# Patient Record
Sex: Male | Born: 1965 | Race: White | Hispanic: No | Marital: Married | Smoking: Never smoker
Health system: Southern US, Community
[De-identification: ages and names within clinical notes are randomized; demographics above are authoritative.]

---

## 2012-03-02 ENCOUNTER — Other Ambulatory Visit: Payer: Self-pay | Admitting: Orthopaedic Surgery

## 2012-03-02 NOTE — H&P (Signed)
  Patient Name: [name], [name]    Date of Birth: 12-06-1965    Shared ID #:  [id number] Date: 02/24/2012    AGE: 46 Years    Sex: M    Employer:     DOI:   History of present illness: A 46 year old male with no significant past medical history coming in with left arm pain.  Patient was lifting weights Friday night he was doing triceps extensions over his head and felt a pop with an audible sound and had significant decrease and strength immediately and had to drop the weight.  Patient states that he had swelling almost immediately but denies any numbness or.  The patient also denies any change in color or bruising.  Patient did go to the urgent care facility where he was told that he might have a tricep tear put in a splint and sent to followup with [place].  Patient states that the pain has been manageable with over-the-counter medications.  Patient states that he's had some more swelling recently.  Patient denies any numbness in the fingertips.  Patient is right-hand dominant.   PMHx: Medications: None.Allergies: None.  Hospitalizations: None.Surgeries: none  ROS: 14 point review of systems form filled out by the patient was reviewed and was negative as it relates to the history of present illness except for: [4]   FHx: Noncontributory  SocHx: Denies smoking and occasional alcohol use.  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  PE: Vitals review  Gen. no apparent distress male Respiratory: Patient able to speak in full sentences no accessory muscle use Abdominal exam: Nontender nondistended The patient's left upper extremity: Patient does have significant swelling of the left arm mostly just proximal to the olecranon and distally to the mid forearm.  Patient has no discoloration or bruising noted.  Patient does have good strength of the left arm board of 5 compared to his contralateral side of 5 out of 5.  Patient is neurovascularly intact distally. Skin no rash no erythema Mood and affect  normal.  Imaging/Tests: X-rays from January 5 were reviewed and shows potential avulsion with a tendinitis contracted from the olecranon.  Otherwise no gross abnormalities. Ultrasound done today showed that likely there is a tear in the triceps tendon at the insertion site hard to estimate percentage of tear.  Appears to be greater than 20% at least.  Significant swelling surrounding area as well.  No fractures noted.  Asses: Left triceps tear at the distal portion  Plan: At this point we do need further imaging an MRI has been ordered.  Patient has goal of going out-of-town on Saturday to the beach.  We will attempt to get this MRI early secondary to the urgency of potential intervention as well.  Patient is a very active man and would like this fixed if necessary.  We will not know until the MRI is done and we know how much of the tendon has been torn.  Patient will continue to wear a sling when out in public trying not to do any strengthening or weightlifting of the upper body until imaging is done. Followup with [place] after MRI to go over her options.  Dictated by Dr. Terrilee Files   Autoauthenticated, Dr. Jerl Santos

## 2012-03-03 ENCOUNTER — Encounter (HOSPITAL_COMMUNITY): Payer: Self-pay | Admitting: *Deleted

## 2012-03-03 ENCOUNTER — Ambulatory Visit (HOSPITAL_COMMUNITY): Payer: BC Managed Care – PPO | Admitting: Anesthesiology

## 2012-03-03 ENCOUNTER — Encounter (HOSPITAL_COMMUNITY)
Admission: RE | Disposition: A | Discharge status: Home / Self Care | Payer: Self-pay | Source: Ambulatory Visit | Attending: Orthopaedic Surgery

## 2012-03-03 ENCOUNTER — Encounter (HOSPITAL_COMMUNITY): Payer: Self-pay | Admitting: Anesthesiology

## 2012-03-03 ENCOUNTER — Ambulatory Visit (HOSPITAL_COMMUNITY)
Admission: RE | Admit: 2012-03-03 | Discharge: 2012-03-03 | Disposition: A | Discharge status: Home / Self Care | Payer: BC Managed Care – PPO | Source: Ambulatory Visit | Attending: Orthopaedic Surgery | Admitting: Orthopaedic Surgery

## 2012-03-03 DIAGNOSIS — Y9239 Other specified sports and athletic area as the place of occurrence of the external cause: Secondary | ICD-10-CM | POA: Insufficient documentation

## 2012-03-03 DIAGNOSIS — Y93B3 Activity, free weights: Secondary | ICD-10-CM | POA: Insufficient documentation

## 2012-03-03 DIAGNOSIS — T148XXA Other injury of unspecified body region, initial encounter: Secondary | ICD-10-CM

## 2012-03-03 DIAGNOSIS — S43499A Other sprain of unspecified shoulder joint, initial encounter: Secondary | ICD-10-CM | POA: Insufficient documentation

## 2012-03-03 DIAGNOSIS — X503XXA Overexertion from repetitive movements, initial encounter: Secondary | ICD-10-CM | POA: Insufficient documentation

## 2012-03-03 DIAGNOSIS — Y998 Other external cause status: Secondary | ICD-10-CM | POA: Insufficient documentation

## 2012-03-03 HISTORY — PX: BICEPS TENDON REPAIR: SHX566

## 2012-03-03 LAB — CBC
MCH: 33.1 pg (ref 26.0–34.0)
MCV: 89.9 fL (ref 78.0–100.0)
Platelets: 201 10*3/uL (ref 150–400)
RDW: 11.5 % (ref 11.5–15.5)
WBC: 5.2 10*3/uL (ref 4.0–10.5)

## 2012-03-03 LAB — SURGICAL PCR SCREEN: MRSA, PCR: NEGATIVE

## 2012-03-03 SURGERY — REPAIR, TENDON, BICEPS, PROXIMAL
Anesthesia: General | Site: Elbow | Laterality: Left | Wound class: Clean

## 2012-03-03 MED ORDER — PROPOFOL 10 MG/ML IV EMUL
INTRAVENOUS | Status: DC | PRN
  Administered 2012-03-03: 250 mg via INTRAVENOUS

## 2012-03-03 MED ORDER — HYDROMORPHONE HCL PF 1 MG/ML IJ SOLN
INTRAMUSCULAR | Status: AC
  Filled 2012-03-03: qty 1

## 2012-03-03 MED ORDER — MUPIROCIN 2 % EX OINT
TOPICAL_OINTMENT | Freq: Once | CUTANEOUS | Status: DC
  Filled 2012-03-03: qty 22

## 2012-03-03 MED ORDER — HYDROMORPHONE HCL PF 1 MG/ML IJ SOLN
0.2500 mg | INTRAMUSCULAR | Status: DC | PRN
  Administered 2012-03-03 (×4): 0.5 mg via INTRAVENOUS

## 2012-03-03 MED ORDER — PROMETHAZINE HCL 25 MG/ML IJ SOLN: 6.2500 mg | INTRAMUSCULAR | Status: DC | PRN

## 2012-03-03 MED ORDER — LACTATED RINGERS IV SOLN
INTRAVENOUS | Status: DC
  Administered 2012-03-03: 14:00:00 via INTRAVENOUS

## 2012-03-03 MED ORDER — ONDANSETRON HCL 4 MG/2ML IJ SOLN
INTRAMUSCULAR | Status: AC
  Administered 2012-03-03: 4 mg via INTRAVENOUS
  Filled 2012-03-03: qty 2

## 2012-03-03 MED ORDER — CEFAZOLIN SODIUM-DEXTROSE 2-3 GM-% IV SOLR
2.0000 g | INTRAVENOUS | Status: AC
  Administered 2012-03-03: 2 g via INTRAVENOUS

## 2012-03-03 MED ORDER — LIDOCAINE HCL (CARDIAC) 20 MG/ML IV SOLN
INTRAVENOUS | Status: DC | PRN
  Administered 2012-03-03: 40 mg via INTRAVENOUS

## 2012-03-03 MED ORDER — OXYCODONE-ACETAMINOPHEN 5-325 MG PO TABS
1.0000 | ORAL_TABLET | Freq: Once | ORAL | Status: AC
  Administered 2012-03-03: 2 via ORAL

## 2012-03-03 MED ORDER — 0.9 % SODIUM CHLORIDE (POUR BTL) OPTIME
TOPICAL | Status: DC | PRN
  Administered 2012-03-03: 1000 mL

## 2012-03-03 MED ORDER — OXYCODONE-ACETAMINOPHEN 5-325 MG PO TABS: 1.0000 | ORAL_TABLET | ORAL | Status: AC | PRN

## 2012-03-03 MED ORDER — NEOSTIGMINE METHYLSULFATE 1 MG/ML IJ SOLN
INTRAMUSCULAR | Status: DC | PRN
  Administered 2012-03-03: 3 mg via INTRAVENOUS

## 2012-03-03 MED ORDER — OXYCODONE-ACETAMINOPHEN 5-325 MG PO TABS
ORAL_TABLET | ORAL | Status: AC
  Filled 2012-03-03: qty 2

## 2012-03-03 MED ORDER — MUPIROCIN 2 % EX OINT
TOPICAL_OINTMENT | CUTANEOUS | Status: AC
  Administered 2012-03-03: 1 via NASAL
  Filled 2012-03-03: qty 22

## 2012-03-03 MED ORDER — CHLORHEXIDINE GLUCONATE 4 % EX LIQD: 60.0000 mL | Freq: Once | CUTANEOUS | Status: DC

## 2012-03-03 MED ORDER — MEPERIDINE HCL 25 MG/ML IJ SOLN: 6.2500 mg | INTRAMUSCULAR | Status: DC | PRN

## 2012-03-03 MED ORDER — BUPIVACAINE-EPINEPHRINE PF 0.5-1:200000 % IJ SOLN
INTRAMUSCULAR | Status: DC | PRN
  Administered 2012-03-03: 30 mL

## 2012-03-03 MED ORDER — MIDAZOLAM HCL 2 MG/2ML IJ SOLN
1.0000 mg | INTRAMUSCULAR | Status: DC | PRN
  Administered 2012-03-03 (×2): 1 mg via INTRAVENOUS

## 2012-03-03 MED ORDER — FENTANYL CITRATE 0.05 MG/ML IJ SOLN
50.0000 ug | INTRAMUSCULAR | Status: DC | PRN
  Administered 2012-03-03 (×2): 50 ug via INTRAVENOUS

## 2012-03-03 MED ORDER — ONDANSETRON HCL 4 MG/2ML IJ SOLN
4.0000 mg | Freq: Once | INTRAMUSCULAR | Status: AC
  Administered 2012-03-03: 4 mg via INTRAVENOUS

## 2012-03-03 MED ORDER — CEFAZOLIN SODIUM-DEXTROSE 2-3 GM-% IV SOLR
INTRAVENOUS | Status: AC
  Filled 2012-03-03: qty 50

## 2012-03-03 MED ORDER — ROCURONIUM BROMIDE 100 MG/10ML IV SOLN
INTRAVENOUS | Status: DC | PRN
  Administered 2012-03-03: 50 mg via INTRAVENOUS

## 2012-03-03 MED ORDER — LACTATED RINGERS IV SOLN
INTRAVENOUS | Status: DC | PRN
  Administered 2012-03-03 (×2): via INTRAVENOUS

## 2012-03-03 MED ORDER — ONDANSETRON HCL 4 MG/2ML IJ SOLN
INTRAMUSCULAR | Status: DC | PRN
  Administered 2012-03-03: 4 mg via INTRAVENOUS

## 2012-03-03 MED ORDER — GLYCOPYRROLATE 0.2 MG/ML IJ SOLN
INTRAMUSCULAR | Status: DC | PRN
  Administered 2012-03-03: .4 mg via INTRAVENOUS

## 2012-03-03 MED ORDER — FENTANYL CITRATE 0.05 MG/ML IJ SOLN
INTRAMUSCULAR | Status: AC
  Filled 2012-03-03: qty 2

## 2012-03-03 MED ORDER — MIDAZOLAM HCL 2 MG/2ML IJ SOLN
INTRAMUSCULAR | Status: AC
  Filled 2012-03-03: qty 2

## 2012-03-03 MED ORDER — FENTANYL CITRATE 0.05 MG/ML IJ SOLN
INTRAMUSCULAR | Status: DC | PRN
  Administered 2012-03-03 (×2): 50 ug via INTRAVENOUS
  Administered 2012-03-03: 100 ug via INTRAVENOUS

## 2012-03-03 MED ORDER — MIDAZOLAM HCL 2 MG/2ML IJ SOLN: 0.5000 mg | Freq: Once | INTRAMUSCULAR | Status: DC | PRN

## 2012-03-03 SURGICAL SUPPLY — 65 items
BANDAGE ELASTIC 3 VELCRO ST LF (GAUZE/BANDAGES/DRESSINGS) IMPLANT
BANDAGE ELASTIC 4 VELCRO ST LF (GAUZE/BANDAGES/DRESSINGS) ×2 IMPLANT
BANDAGE GAUZE ELAST BULKY 4 IN (GAUZE/BANDAGES/DRESSINGS) ×2 IMPLANT
BENZOIN TINCTURE PRP APPL 2/3 (GAUZE/BANDAGES/DRESSINGS) IMPLANT
BNDG ESMARK 4X9 LF (GAUZE/BANDAGES/DRESSINGS) ×2 IMPLANT
CLOTH BEACON ORANGE TIMEOUT ST (SAFETY) ×2 IMPLANT
CUFF TOURNIQUET SINGLE 18IN (TOURNIQUET CUFF) ×2 IMPLANT
CUFF TOURNIQUET SINGLE 24IN (TOURNIQUET CUFF) IMPLANT
DECANTER SPIKE VIAL GLASS SM (MISCELLANEOUS) IMPLANT
DRAIN PENROSE 1/4X12 LTX STRL (WOUND CARE) IMPLANT
DRAPE OEC MINIVIEW 54X84 (DRAPES) ×2 IMPLANT
DRAPE U-SHAPE 47X51 STRL (DRAPES) ×2 IMPLANT
DRSG EMULSION OIL 3X3 NADH (GAUZE/BANDAGES/DRESSINGS) ×2 IMPLANT
DRSG PAD ABDOMINAL 8X10 ST (GAUZE/BANDAGES/DRESSINGS) ×2 IMPLANT
ELECT REM PT RETURN 9FT ADLT (ELECTROSURGICAL) ×2
ELECTRODE REM PT RTRN 9FT ADLT (ELECTROSURGICAL) ×1 IMPLANT
GLOVE BIO SURGEON STRL SZ8.5 (GLOVE) ×2 IMPLANT
GLOVE BIOGEL PI IND STRL 8 (GLOVE) ×1 IMPLANT
GLOVE BIOGEL PI IND STRL 8.5 (GLOVE) ×1 IMPLANT
GLOVE BIOGEL PI INDICATOR 8 (GLOVE) ×1
GLOVE BIOGEL PI INDICATOR 8.5 (GLOVE) ×1
GLOVE SS BIOGEL STRL SZ 8 (GLOVE) ×1 IMPLANT
GLOVE SUPERSENSE BIOGEL SZ 8 (GLOVE) ×1
GOWN PREVENTION PLUS XLARGE (GOWN DISPOSABLE) ×4 IMPLANT
GOWN PREVENTION PLUS XXLARGE (GOWN DISPOSABLE) ×2 IMPLANT
GOWN STRL NON-REIN LRG LVL3 (GOWN DISPOSABLE) ×4 IMPLANT
KIT BASIN OR (CUSTOM PROCEDURE TRAY) ×2 IMPLANT
KIT ROOM TURNOVER OR (KITS) ×2 IMPLANT
LOOP VESSEL MAXI BLUE (MISCELLANEOUS) IMPLANT
NDL SUT 6 .5 CRC .975X.05 MAYO (NEEDLE) IMPLANT
NEEDLE HYPO 25X1 1.5 SAFETY (NEEDLE) IMPLANT
NEEDLE MAYO TAPER (NEEDLE)
NS IRRIG 1000ML POUR BTL (IV SOLUTION) ×2 IMPLANT
PACK ORTHO EXTREMITY (CUSTOM PROCEDURE TRAY) ×2 IMPLANT
PAD ARMBOARD 7.5X6 YLW CONV (MISCELLANEOUS) ×4 IMPLANT
PAD CAST 4YDX4 CTTN HI CHSV (CAST SUPPLIES) ×1 IMPLANT
PADDING CAST ABS 4INX4YD NS (CAST SUPPLIES) ×1
PADDING CAST ABS COTTON 4X4 ST (CAST SUPPLIES) ×1 IMPLANT
PADDING CAST COTTON 4X4 STRL (CAST SUPPLIES) ×1
SLING ARM FOAM STRAP LRG (SOFTGOODS) ×2 IMPLANT
SLING ARM FOAM STRAP MED (SOFTGOODS) IMPLANT
SPONGE GAUZE 4X4 12PLY (GAUZE/BANDAGES/DRESSINGS) ×2 IMPLANT
SPONGE LAP 4X18 X RAY DECT (DISPOSABLE) IMPLANT
STRIP CLOSURE SKIN 1/2X4 (GAUZE/BANDAGES/DRESSINGS) IMPLANT
SUCTION FRAZIER TIP 10 FR DISP (SUCTIONS) ×2 IMPLANT
SUT 2 FIBERLOOP 20 STRT BLUE (SUTURE)
SUT BONE WAX W31G (SUTURE) IMPLANT
SUT ETHILON 3 0 PS 1 (SUTURE) ×4 IMPLANT
SUT ETHILON 4 0 PS 2 18 (SUTURE) ×2 IMPLANT
SUT FIBERWIRE #2 38 T-5 BLUE (SUTURE) ×4
SUT PROLENE 4 0 PS 2 18 (SUTURE) IMPLANT
SUT VIC AB 0 CT1 27 (SUTURE) ×1
SUT VIC AB 0 CT1 27XBRD ANBCTR (SUTURE) ×1 IMPLANT
SUT VIC AB 1 CT1 27 (SUTURE)
SUT VIC AB 1 CT1 27XBRD ANBCTR (SUTURE) IMPLANT
SUT VIC AB 2-0 SH 27 (SUTURE) ×1
SUT VIC AB 2-0 SH 27XBRD (SUTURE) ×1 IMPLANT
SUTURE 2 FIBERLOOP 20 STRT BLU (SUTURE) IMPLANT
SUTURE FIBERWR #2 38 T-5 BLUE (SUTURE) ×2 IMPLANT
SYR CONTROL 10ML LL (SYRINGE) IMPLANT
TOWEL OR 17X24 6PK STRL BLUE (TOWEL DISPOSABLE) ×2 IMPLANT
TOWEL OR 17X26 10 PK STRL BLUE (TOWEL DISPOSABLE) ×2 IMPLANT
TUBE CONNECTING 12X1/4 (SUCTIONS) ×2 IMPLANT
UNDERPAD 30X30 INCONTINENT (UNDERPADS AND DIAPERS) ×2 IMPLANT
WATER STERILE IRR 1000ML POUR (IV SOLUTION) ×2 IMPLANT

## 2012-03-03 NOTE — Transfer of Care (Signed)
Immediate Anesthesia Transfer of Care Note  Patient: [name]  Procedure(s) Performed: Procedure(s) (LRB): PROXIMAL BICEPS TENDON REPAIR (Left)  Patient Location: PACU  Anesthesia Type: General and Regional  Level of Consciousness: awake, alert  and oriented  Airway & Oxygen Therapy: Patient Spontanous Breathing and Patient connected to nasal cannula oxygen  Post-op Assessment: Report given to PACU RN  Post vital signs: Reviewed and stable  Complications: No apparent anesthesia complications

## 2012-03-03 NOTE — Anesthesia Procedure Notes (Signed)
Anesthesia Regional Block:  Interscalene brachial plexus block  Pre-Anesthetic Checklist: ,, timeout performed, Correct Patient, Correct Site, Correct Laterality, Correct Procedure, Correct Position, site marked, Risks and benefits discussed,  Surgical consent,  Pre-op evaluation,  At surgeon's request and post-op pain management  Laterality: Left  Prep: chloraprep       Needles:  Injection technique: Single-shot  Needle Type: Echogenic Stimulator Needle     Needle Length: 5cm 5 cm Needle Gauge: 22 and 22 G    Additional Needles:  Procedures: ultrasound guided and nerve stimulator Interscalene brachial plexus block  Nerve Stimulator or Paresthesia:  Response: biceps flexion, 0.45 mA,   Additional Responses:   Narrative:  Start time: 03/03/2012 2:22 PM End time: 03/03/2012 2:36 PM Injection made incrementally with aspirations every 5 mL.  Performed by: Personally  Anesthesiologist: Dr Chaney Malling  Additional Notes: Functioning IV was confirmed and monitors were applied.  A 50mm 22ga Arrow echogenic stimulator needle was used. Sterile prep and drape,hand hygiene and sterile gloves were used.  Negative aspiration and negative test dose prior to incremental administration of local anesthetic. The patient tolerated the procedure well.  Ultrasound guidance: relevent anatomy identified, needle position confirmed, local anesthetic spread visualized around nerve(s), vascular puncture avoided.  Image printed for medical record.   Interscalene brachial plexus block

## 2012-03-03 NOTE — Op Note (Signed)
#  [id number] 

## 2012-03-03 NOTE — Progress Notes (Signed)
CLIENT DRANK COKE AND THEN VOMITED ABOUT 200CC YELLOW LIQUID AND THEN STATES FEELS SOME BETTER AND STATES DOES WANT TO GO HOME

## 2012-03-03 NOTE — Anesthesia Postprocedure Evaluation (Signed)
  Anesthesia Post-op Note  Patient: [name]  Procedure(s) Performed: Procedure(s) (LRB): PROXIMAL BICEPS TENDON REPAIR (Left)  Patient Location: PACU  Anesthesia Type: GA combined with regional for post-op pain  Level of Consciousness: sedated  Airway and Oxygen Therapy: Patient Spontanous Breathing and Patient connected to nasal cannula oxygen  Post-op Pain: mild  Post-op Assessment: Post-op Vital signs reviewed, Patient's Cardiovascular Status Stable, Respiratory Function Stable, Patent Airway, No signs of Nausea or vomiting and Pain level controlled  Post-op Vital Signs: Reviewed and stable  Complications: No apparent anesthesia complications

## 2012-03-03 NOTE — Progress Notes (Signed)
DR OSSEY NOTIFIED OF C/O NAUSEA AND ORDER NOTED AND MED GIVEN AND THEN CLIENT C/O 6/10 LEFT ELBOW PAIN AND DR OSSEY ADVISED CLIENT CAN BE DISCHARGED WITH PAIN OR CAN RETURN TO PACU FOR IV PAIN MED AND CLIENT STATES WANTS TO GO HOME

## 2012-03-03 NOTE — Preoperative (Signed)
Beta Blockers   Reason not to administer Beta Blockers:Not Applicable 

## 2012-03-03 NOTE — Interval H&P Note (Signed)
History and Physical Interval Note:  03/03/2012 1:02 PM  [name]  has presented today for surgery, with the diagnosis of LEFT TRICEPS TEAR  The various methods of treatment have been discussed with the patient and family. After consideration of risks, benefits and other options for treatment, the patient has consented to  Procedure(s) (LRB): PROXIMAL BICEPS TENDON REPAIR (Left) as a surgical intervention .  The patient's history has been reviewed, patient examined, no change in status, stable for surgery.  I have reviewed the patients' chart and labs.  Questions were answered to the patient's satisfaction.     Mikhayla Phillis G

## 2012-03-03 NOTE — OR Nursing (Signed)
Feels lightheaded upon rising/ sat on SOb foro @ and has resided slowly, continues to ask to go, states he is " feeling better"

## 2012-03-03 NOTE — Anesthesia Preprocedure Evaluation (Signed)
Anesthesia Evaluation  Patient identified by MRN, date of birth, ID band Patient awake    Reviewed: Allergy & Precautions, H&P , NPO status , Patient's Chart, lab work & pertinent test results  History of Anesthesia Complications Negative for: history of anesthetic complications  Airway Mallampati: I TM Distance: >3 FB Neck ROM: Full    Dental No notable dental hx. (+) Teeth Intact and Dental Advisory Given   Pulmonary neg pulmonary ROS,  breath sounds clear to auscultation  Pulmonary exam normal       Cardiovascular negative cardio ROS  Rhythm:Regular Rate:Normal     Neuro/Psych negative neurological ROS  negative psych ROS   GI/Hepatic negative GI ROS, Neg liver ROS,   Endo/Other  negative endocrine ROS  Renal/GU negative Renal ROS     Musculoskeletal   Abdominal   Peds  Hematology   Anesthesia Other Findings   Reproductive/Obstetrics                           Anesthesia Physical Anesthesia Plan  ASA: I  Anesthesia Plan: General   Post-op Pain Management:    Induction: Intravenous  Airway Management Planned: Oral ETT  Additional Equipment:   Intra-op Plan:   Post-operative Plan: Extubation in OR  Informed Consent: I have reviewed the patients History and Physical, chart, labs and discussed the procedure including the risks, benefits and alternatives for the proposed anesthesia with the patient or authorized representative who has indicated his/her understanding and acceptance.   Dental advisory given  Plan Discussed with: CRNA and Surgeon  Anesthesia Plan Comments: (Plan routine monitors, GETA with interscalene block for post op analgesia)        Anesthesia Quick Evaluation

## 2012-03-04 ENCOUNTER — Encounter (HOSPITAL_COMMUNITY): Payer: Self-pay | Admitting: Orthopaedic Surgery

## 2012-03-04 NOTE — Op Note (Signed)
[name], [name] NO.:  [id number]  MEDICAL RECORD NO.:  [id number]  LOCATION:  [facility]                         FACILITY:  [facility]  PHYSICIAN:  Lubertha Basque. Ann-Marie Kluge, M.D.DATE OF BIRTH:  1966-05-24  DATE OF PROCEDURE:  03/03/2012 DATE OF DISCHARGE:  03/03/2012                              OPERATIVE REPORT   PREOPERATIVE DIAGNOSIS:  Left triceps rupture.  POSTOPERATIVE DIAGNOSIS:  Left triceps rupture.  PROCEDURE:  Left triceps repair.  ANESTHESIA:  General and block.  ATTENDING SURGEON:  Lubertha Basque. Jerl Santos, MD  ASSISTANT:  Lindwood Qua, PA  INDICATION FOR PROCEDURE:  The patient is a 46 year old man who ruptured his left triceps about a week ago while weightlifting.  By MRI scan, this involves most of the triceps and is pulled off by 1 cm or 2.  He is offered operative repair.  Informed operative consent was obtained after discussion of possible complications including reaction to anesthesia, infection, neurovascular injury, and repeat rupture.  SUMMARY OF FINDINGS AND PROCEDURE:  Under general anesthesia and a block through a straight posterior incision, a left triceps rupture was exposed and repaired using 4 limbs of #2 FiberWire secured to the olecranon through 3 bony tunnels.  Bryna Colander assisted throughout and was invaluable to the completion of the case in that he helped to maintain exposure while I placed suture material.  He also closed simultaneously to help minimize OR time.  DESCRIPTION OF PROCEDURE:  The patient was taken to the operating suite where general anesthetic was applied without difficulty.  He was also given a block in the pre-anesthesia area.  He was positioned in the lateral decubitus position with a beanbag and use of an axillary roll. He was then prepped and draped in normal sterile fashion.  After the administration of IV Kefzol and an appropriate time out, the left arm was elevated, exsanguinated, and tourniquet inflated  about the upper arm.  A posterior incision was made just slightly lateral to midline with dissection down to the extensor mechanism.  He had a great deal of bursal fluid and some of the bursal sac was excised.  The triceps was completely detached and shortened by 1 cm or 2.  This was thoroughly irrigated.  I freshened the attachment site of the olecranon with knife and rongeur.  I then made 3 longitudinal drill holes from the attachment site on the olecranon out the dorsal aspect of the ulna.  I placed 2 FiberWires through the tunnels with 2 limbs through the middle tunnel and 1 each through the medial and lateral tunnels.  We then passed a banal fashion through the triceps tendon.  I kept the knots on the triceps size that would not be prominent at the olecranon.  Once both of these #2 FiberWire sutures were oversewn down, this seemed to give [place] a nice tight repair.  I could easily flex in 90 degrees and the repair was stable.  I over sewed just some tissues with 0-Vicryl.  The tourniquet was deflated and a small amount of bleeding was easily controlled with Bovie cautery and some pressure.  I then reapproximated subcutaneous tissues with 0 and 2-0 undyed  Vicryl followed by skin closure with nylon.  Adaptic was applied followed by dry gauze and a posterior splint of plaster with the elbow in about 60 degrees of flexion.  Estimated blood loss and intraoperative fluids can be obtained from anesthesia records as can accurate tourniquet time.  DISPOSITION:  The patient was extubated in the operating room and taken to the recovery room in stable condition.  He was to go home same-day and follow up in the office in less than a week.  I will contact him by phone tonight.     Lubertha Basque Jerl Santos, M.D.     PGD/MEDQ  D:  03/03/2012  T:  03/04/2012  Job:  [id number]

## 2017-12-31 ENCOUNTER — Emergency Department (HOSPITAL_COMMUNITY)
Admission: EM | Admit: 2017-12-31 | Discharge: 2017-12-31 | Disposition: A | Discharge status: Home / Self Care | Payer: BLUE CROSS/BLUE SHIELD | Attending: Emergency Medicine | Admitting: Emergency Medicine

## 2017-12-31 ENCOUNTER — Emergency Department (HOSPITAL_COMMUNITY): Payer: BLUE CROSS/BLUE SHIELD

## 2017-12-31 ENCOUNTER — Encounter (HOSPITAL_COMMUNITY): Payer: Self-pay | Admitting: Emergency Medicine

## 2017-12-31 DIAGNOSIS — F10239 Alcohol dependence with withdrawal, unspecified: Secondary | ICD-10-CM | POA: Diagnosis not present

## 2017-12-31 DIAGNOSIS — F10230 Alcohol dependence with withdrawal, uncomplicated: Secondary | ICD-10-CM

## 2017-12-31 DIAGNOSIS — R42 Dizziness and giddiness: Secondary | ICD-10-CM | POA: Diagnosis not present

## 2017-12-31 DIAGNOSIS — Z79899 Other long term (current) drug therapy: Secondary | ICD-10-CM | POA: Diagnosis not present

## 2017-12-31 LAB — URINALYSIS, ROUTINE W REFLEX MICROSCOPIC
BILIRUBIN URINE: NEGATIVE
Glucose, UA: NEGATIVE mg/dL
HGB URINE DIPSTICK: NEGATIVE
KETONES UR: NEGATIVE mg/dL
Leukocytes, UA: NEGATIVE
NITRITE: NEGATIVE
PROTEIN: NEGATIVE mg/dL
SPECIFIC GRAVITY, URINE: 1.003 — AB (ref 1.005–1.030)
pH: 6 (ref 5.0–8.0)

## 2017-12-31 LAB — CBC
HCT: 45.9 % (ref 39.0–52.0)
HEMOGLOBIN: 16.8 g/dL (ref 13.0–17.0)
MCH: 33.3 pg (ref 26.0–34.0)
MCHC: 36.6 g/dL — AB (ref 30.0–36.0)
MCV: 91.1 fL (ref 78.0–100.0)
PLATELETS: 219 10*3/uL (ref 150–400)
RBC: 5.04 MIL/uL (ref 4.22–5.81)
RDW: 11.7 % (ref 11.5–15.5)
WBC: 8.3 10*3/uL (ref 4.0–10.5)

## 2017-12-31 LAB — HEPATIC FUNCTION PANEL
ALT: 41 U/L (ref 17–63)
AST: 39 U/L (ref 15–41)
Albumin: 4.6 g/dL (ref 3.5–5.0)
Alkaline Phosphatase: 68 U/L (ref 38–126)
Bilirubin, Direct: 0.1 mg/dL (ref 0.1–0.5)
Indirect Bilirubin: 0.5 mg/dL (ref 0.3–0.9)
Total Bilirubin: 0.6 mg/dL (ref 0.3–1.2)
Total Protein: 7.9 g/dL (ref 6.5–8.1)

## 2017-12-31 LAB — BASIC METABOLIC PANEL
ANION GAP: 12 (ref 5–15)
BUN: 21 mg/dL — ABNORMAL HIGH (ref 6–20)
CALCIUM: 9.2 mg/dL (ref 8.9–10.3)
CHLORIDE: 101 mmol/L (ref 101–111)
CO2: 23 mmol/L (ref 22–32)
CREATININE: 0.96 mg/dL (ref 0.61–1.24)
GFR calc Af Amer: >60 mL/min (ref >60)
Glucose, Bld: 156 mg/dL — ABNORMAL HIGH (ref 65–99)
Potassium: 3.7 mmol/L (ref 3.5–5.1)
SODIUM: 136 mmol/L (ref 135–145)

## 2017-12-31 MED ORDER — CHLORDIAZEPOXIDE HCL 25 MG PO CAPS: ORAL_CAPSULE | ORAL | 0 refills | Status: AC

## 2017-12-31 MED ORDER — VITAMIN B-1 100 MG PO TABS: 100.0000 mg | ORAL_TABLET | Freq: Every day | ORAL | Status: DC

## 2017-12-31 MED ORDER — LORAZEPAM 2 MG/ML IJ SOLN: 0.0000 mg | Freq: Four times a day (QID) | INTRAMUSCULAR | Status: DC

## 2017-12-31 MED ORDER — LORAZEPAM 2 MG/ML IJ SOLN
1.0000 mg | Freq: Once | INTRAMUSCULAR | Status: AC
  Administered 2017-12-31: 1 mg via INTRAVENOUS
  Filled 2017-12-31: qty 1

## 2017-12-31 MED ORDER — THIAMINE HCL 100 MG/ML IJ SOLN
100.0000 mg | Freq: Every day | INTRAMUSCULAR | Status: DC
  Administered 2017-12-31: 100 mg via INTRAVENOUS
  Filled 2017-12-31: qty 2

## 2017-12-31 MED ORDER — LORAZEPAM 1 MG PO TABS: 0.0000 mg | ORAL_TABLET | Freq: Four times a day (QID) | ORAL | Status: DC

## 2017-12-31 MED ORDER — LORAZEPAM 2 MG/ML IJ SOLN: 0.0000 mg | Freq: Two times a day (BID) | INTRAMUSCULAR | Status: DC

## 2017-12-31 MED ORDER — LORAZEPAM 1 MG PO TABS: 0.0000 mg | ORAL_TABLET | Freq: Two times a day (BID) | ORAL | Status: DC

## 2017-12-31 MED ORDER — DIAZEPAM 5 MG PO TABS
10.0000 mg | ORAL_TABLET | Freq: Once | ORAL | Status: AC
  Administered 2017-12-31: 10 mg via ORAL
  Filled 2017-12-31: qty 2

## 2017-12-31 MED ORDER — SODIUM CHLORIDE 0.9 % IV BOLUS
1000.0000 mL | Freq: Once | INTRAVENOUS | Status: AC
  Administered 2017-12-31: 1000 mL via INTRAVENOUS

## 2017-12-31 NOTE — Discharge Instructions (Signed)
You may take the clonidine taper as prescribed but do not drink any alcohol if you plan on doing this.  Alternatively, you may slowly wean yourself down on your usual alcohol intake.  Drink plenty of water and get plenty of rest.  Follow-up with a primary care physician for reevaluation.  Recheck your blood pressure occasionally.  If it is persistently elevated, you may need to follow-up with your primary care physician for medication management.  You may also follow-up with numerous outpatient resources that I have attached to this paperwork.  Return to the emergency department immediately for any concerning signs or symptoms develop such as hallucinations, seizures, high fevers, vision changes, or passing out.

## 2017-12-31 NOTE — ED Triage Notes (Addendum)
Pt reports while today at home working on computer felt dizzy and blurred vision when checking an email from being outside for 15 mintues. Repots had breakfast and lunch and protein shakes today. Reports drank two bottles of water and called wife. Reports laid down then went [facility] where feeling better but was told to go to ED. Repots lasted about 2 hours and feels fine. Only complaint at this time is dry mouth.

## 2017-12-31 NOTE — ED Provider Notes (Signed)
[facility]-EMERGENCY DEPT Provider Note   CSN: [id number] Arrival date & time: 12/31/17  1714     History   Chief Complaint Chief Complaint  Patient presents with  . Dizziness  . dry mouth    HPI [name] is a 52 y.o. male presents for evaluation of acute onset, resolved episode of dizziness and blurred vision.  Patient states that he typically works from home and spent around 15 minutes outside resting when he went back inside at around 2 PM working on his computer.  He states that he developed sudden onset of blurred vision and feeling unsteady and "wobbly ".  He states that he had one episode which lasted less than a minute and resolved but a few minutes later developed another episode with similar symptoms lasting over an hour.  He states that he felt unsteady while ambulating but denies any falls or trauma.  He denies syncope.  No aggravating or alleviating factors noted.  His symptoms did not worsen positionally but he did note that he felt "okay "with his eyes closed.  He went to an urgent care who recommended presentation to the ED.  At around 4:30 PM his symptoms spontaneously resolved.  He states his only residual symptom is he feels very thirsty and has a dry mouth.  He does not think that he has been dehydrated as he typically drinks 2-4 bottles full of water daily.  He also drinks at least 6 light beers daily after work, more on the weekends. He generally has difficulty quantifying how much alcohol he drinks daily. He does state that with the improving weather, he has been drinking more alcohol daily. Last alcoholic beverage was last night, almost 24 hours ago. He is a non-smoker, denies any recreational drug use.  He denies any associated shortness of breath, chest pain, headaches, diaphoresis, nausea, vomiting, or lightheadedness.  He did not try anything for his symptoms prior to arrival. He has not been evaluated by a primary care physician in 7 years and is  unsure if he has any chronic medical conditions.   The history is provided by the patient.    History reviewed. No pertinent past medical history.  There are no active problems to display for this patient.   Past Surgical History:  Procedure Laterality Date  . BICEPS TENDON REPAIR  03/03/2012   Procedure: PROXIMAL BICEPS TENDON REPAIR;  Surgeon: Velna Ochs, MD;  Location: [facility] OR;  Service: Orthopedics;  Laterality: Left;  Left Tricep Tendon Repair        Home Medications    Prior to Admission medications   Medication Sig Start Date End Date Taking? Authorizing Provider  Multiple Vitamin (MULTIVITAMIN WITH MINERALS) TABS Take 1 tablet by mouth daily.   Yes [provider]  chlordiazePOXIDE (LIBRIUM) 25 MG capsule  PO TID x 1D, then 25-50mg  PO BID X 1D, then 25-50mg  PO QD X 1D 12/31/17   Valeska Haislip A, PA-C    Family History No family history on file.  Social History Social History   Tobacco Use  . Smoking status: Never Smoker  . Smokeless tobacco: Never Used  Substance Use Topics  . Alcohol use: Yes  . Drug use: No     Allergies   Patient has no known allergies.   Review of Systems Review of Systems  Constitutional: Negative for diaphoresis and fatigue.  Eyes: Positive for visual disturbance.  Respiratory: Negative for shortness of breath.   Cardiovascular: Negative for chest pain.  Gastrointestinal: Negative for abdominal pain, nausea and vomiting.  Neurological: Positive for dizziness. Negative for syncope, speech difficulty, weakness, light-headedness, numbness and headaches.  All other systems reviewed and are negative.    Physical Exam Updated Vital Signs BP 127/78   Pulse 95   Temp 98.1 F (36.7 C) (Oral)   Resp 18   SpO2 96%   Physical Exam  Constitutional: He is oriented to person, place, and time. He appears well-developed and well-nourished. No distress.  HENT:  Head: Normocephalic and atraumatic.  Eyes: Pupils are equal,  round, and reactive to light. Conjunctivae and EOM are normal. Right eye exhibits no discharge. Left eye exhibits no discharge.  Neck: Normal range of motion. Neck supple. No JVD present. No tracheal deviation present.  Cardiovascular: Regular rhythm.  Tachycardic, 2+ radial and DP/PT pulses bl, negative Homan's bl, no LE edema   Pulmonary/Chest: Effort normal and breath sounds normal.  Abdominal: Soft. Bowel sounds are normal. He exhibits no distension. There is hepatomegaly. There is no tenderness. There is no guarding.  Musculoskeletal: Normal range of motion. He exhibits no edema or tenderness.  Neurological: He is alert and oriented to person, place, and time. No cranial nerve deficit or sensory deficit. He exhibits normal muscle tone.  Mental Status:  Alert, thought content appropriate, able to give a coherent history. Speech fluent without evidence of aphasia. Able to follow 2 step commands without difficulty.  Cranial Nerves:  II:  Peripheral visual fields grossly normal, pupils equal, round, reactive to light III,IV, VI: ptosis not present, extra-ocular motions intact bilaterally  V,VII: smile symmetric, facial light touch sensation equal VIII: hearing grossly normal to voice  X: uvula elevates symmetrically  XI: bilateral shoulder shrug symmetric and strong XII: midline tongue extension without fassiculations Motor:  Normal tone. 5/5 strength of BUE and BLE major muscle groups including strong and equal grip strength and dorsiflexion/plantar flexion Sensory: light touch normal in all extremities. Cerebellar: normal finger-to-nose with bilateral upper extremities Gait: normal gait and balance. Able to walk on toes and heels with ease.  No nystagmus, no pronator drift. Romberg sign absent.     Skin: Skin is dry. No erythema.  Psychiatric: His mood appears anxious.  Appears anxious, speaking quickly but does not appear to be responding to internal stimuli at this time  Nursing  note and vitals reviewed.    ED Treatments / Results  Labs (all labs ordered are listed, but only abnormal results are displayed) Labs Reviewed  BASIC METABOLIC PANEL - Abnormal; Notable for the following components:      Result Value   Glucose, Bld 156 (*)    BUN 21 (*)    All other components within normal limits  CBC - Abnormal; Notable for the following components:   MCHC 36.6 (*)    All other components within normal limits  URINALYSIS, ROUTINE W REFLEX MICROSCOPIC - Abnormal; Notable for the following components:   Color, Urine STRAW (*)    Specific Gravity, Urine 1.003 (*)    All other components within normal limits  HEPATIC FUNCTION PANEL  CBG MONITORING, ED    EKG EKG Interpretation  Date/Time:  Wednesday Dec 31 2017 19:10:03 EDT Ventricular Rate:  118 PR Interval:    QRS Duration: 105 QT Interval:  329 QTC Calculation: 461 R Axis:   -50 Text Interpretation:  Sinus tachycardia Left anterior fascicular block Probable left ventricular hypertrophy No previous ECGs available Confirmed by Frederick Peers [phone]) on 12/31/2017 7:34:03 PM Also confirmed by Little,  Fleet Contras [phone]), editor Lodema Hong, Tamera Punt ([id number])  on 01/01/2018 9:26:53 AM   Radiology Ct Head Wo Contrast  Result Date: 12/31/2017 CLINICAL DATA:  Ataxia, stroke suspected EXAM: CT HEAD WITHOUT CONTRAST TECHNIQUE: Contiguous axial images were obtained from the base of the skull through the vertex without intravenous contrast. COMPARISON:  None. FINDINGS: Brain: No evidence of acute infarction, hemorrhage, hydrocephalus, extra-axial collection or mass lesion/mass effect. Vascular: No hyperdense vessel or unexpected calcification. Skull: Normal. Negative for fracture or focal lesion. Sinuses/Orbits: Negative IMPRESSION: Negative head CT. Electronically Signed   By: Marnee Spring M.D.   On: 12/31/2017 20:13    Procedures Procedures (including critical care time)  Medications Ordered in ED Medications  sodium  chloride 0.9 % bolus 1,000 mL (0 mLs Intravenous Stopped 12/31/17 2015)  LORazepam (ATIVAN) injection 1 mg (1 mg Intravenous Given 12/31/17 2130)  diazepam (VALIUM) tablet 10 mg (10 mg Oral Given 12/31/17 2233)     Initial Impression / Assessment and Plan / ED Course  I have reviewed the triage vital signs and the nursing notes.  Pertinent labs & imaging results that were available during my care of the patient were reviewed by me and considered in my medical decision making (see chart for details).     Patient presents for evaluation of an episode of dizziness which resolved prior to my assessment.  He is afebrile, initially quite tachycardic and hypertensive while in the ED.  He admits to me that he feels anxious and has significant anxiety regarding being in the hospital.  He also tells me that he "drinks a lot more than I should".  He is nontoxic in appearance. No focal neurologic deficits on examination.  He states his symptoms are entirely resolved at this time aside from excessive thirst and dry mouth.  He is ambulatory without difficulty.  Lab work reviewed by me significant for mild hyperglycemia (glucose 156), LFTs within normal limits.  Patient is not anemic.  UA is not concerning for UTI or nephrolithiasis. His EKG shows sinus tachycardia and probable left ventricular hypertrophy but no ST segment abnormality or ischemic changes. Head CT shows no acute intracranial abnormalities. Doubt CVA,  ICH, SAH, acoustic neuroma. History and physical examination does not suggest BPPV.    Patient did disclose a history of alcohol dependence, last alcoholic beverage was yesterday almost 24 hours prior to my assessment.  He is tachycardic and hypertensive and mildly tremulous on exam.  He has been increasing his alcohol intake more recently due to the nice weather.  There is some concern for alcohol withdrawal.  CIWA protocol was initiated.  Patient improved with IV Ativan and normal saline bolus.  He  further improved with p.o. Valium.  On reevaluation the patient is resting comfortably and his vitals have entirely normalized.  He states he feels much better and is currently completely asymptomatic.  He has had no recurrence of his dizziness.  Myself and Dr. Clarene Duke had long discussions regarding his alcohol use and the patient states he feels committed to quitting drinking alcohol.  He was offered a Librium taper and we discussed a weaning schedule as an alternative.  He states he would like to try to wean himself off of beer but would like a prescription for Librium taper as a back-up plan.  I instructed him that he cannot drink any alcohol if he is going to take the Librium taper.  He states he will call to set up a follow-up appointment with a primary care physician this  week.  Discussed strict ED return precautions.  Patient and patient's wife verbalized understanding of and agreement with plan and patient is stable for discharge home at this time.  Patient was seen and evaluated by Dr. Clarene Duke who agrees with assessment and plan at this time.   Final Clinical Impressions(s) / ED Diagnoses   Final diagnoses:  Dizziness  Alcohol dependence with uncomplicated withdrawal [facility])    ED Discharge Orders        Ordered    chlordiazePOXIDE (LIBRIUM) 25 MG capsule     12/31/17 2322       Jeanie Sewer, PA-C 01/01/18 1328    Little, Ambrose Finland, MD 01/02/18 1301

## 2019-03-13 IMAGING — CT CT HEAD W/O CM
3 series · 16 of 47 positions shown, 19 images · non-contrast
Comparison: None.

CLINICAL DATA: Ataxia, stroke suspected

EXAM:
CT HEAD WITHOUT CONTRAST
TECHNIQUE: Contiguous axial images were obtained from the base of the skull
through the vertex without intravenous contrast.

[Series 2: head wo · axial · 0.47mm/px · z∈[-179,-39]mm · 10 of 34 slices shown, 13 images]
[im 3/34  brain]
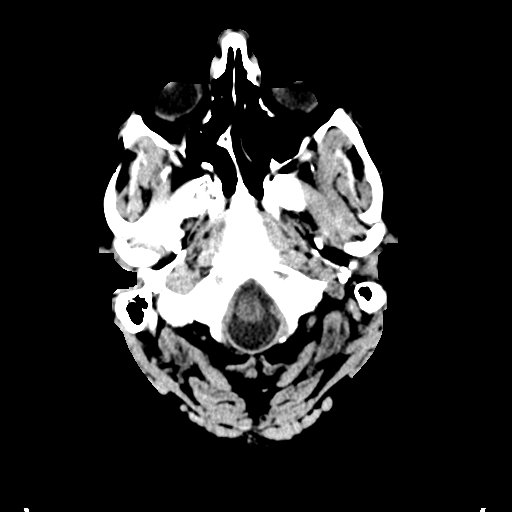
[im 3/34  bone]
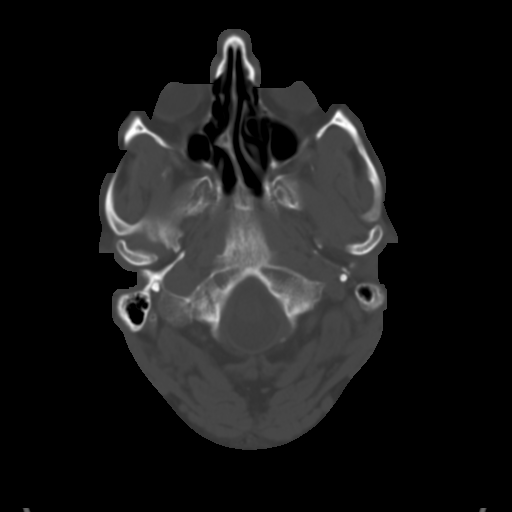
[im 6/34  brain]
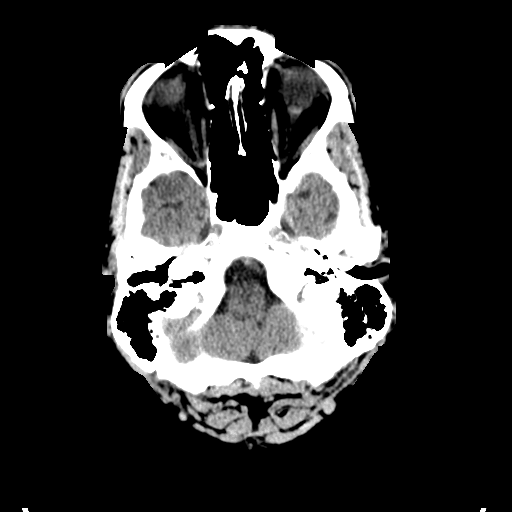
[im 10/34  brain]
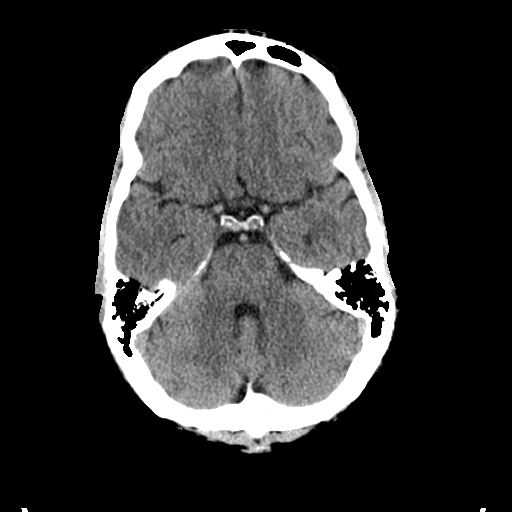
[im 12/34  brain]
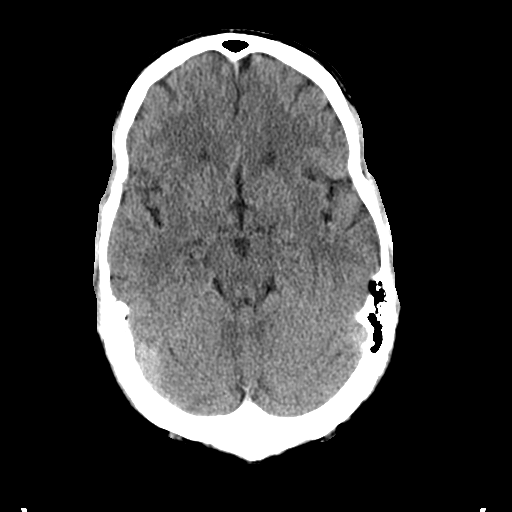
[im 15/34  brain]
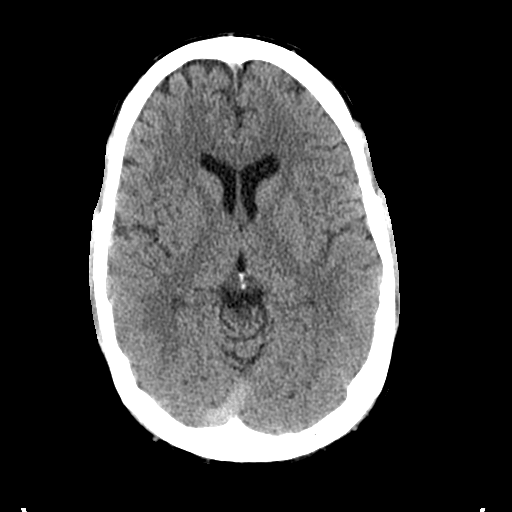
[im 15/34  bone]
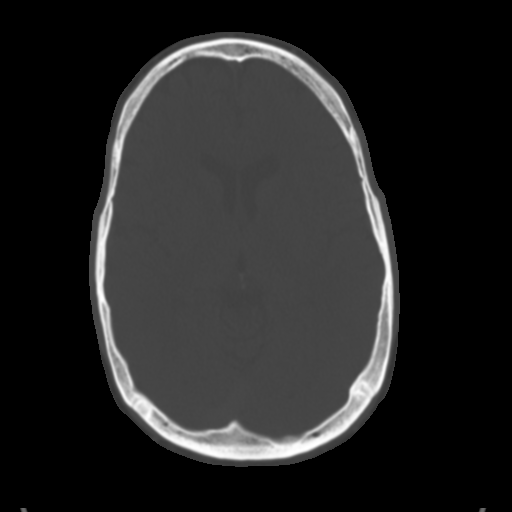
[im 19/34  brain]
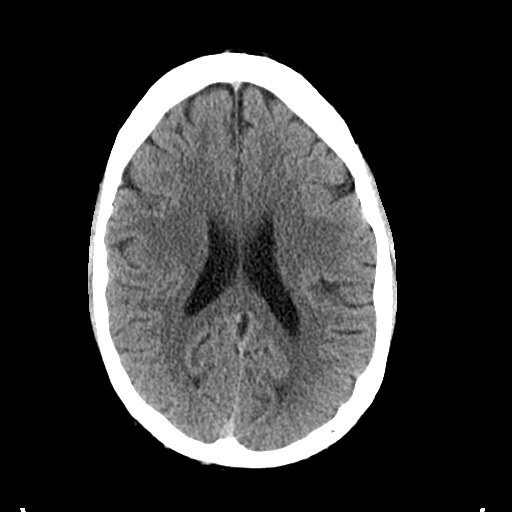
[im 22/34  brain]
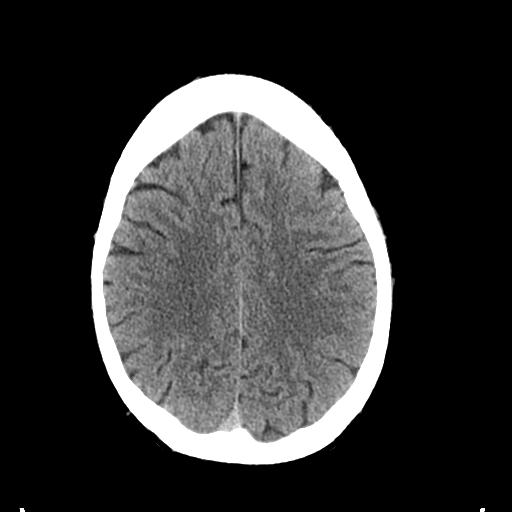
[im 26/34  brain]
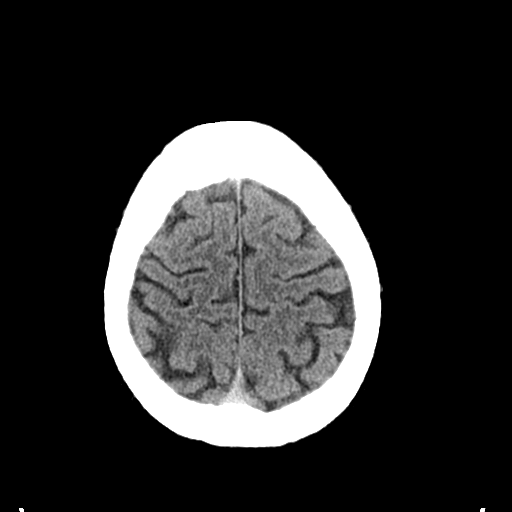
[im 28/34  brain]
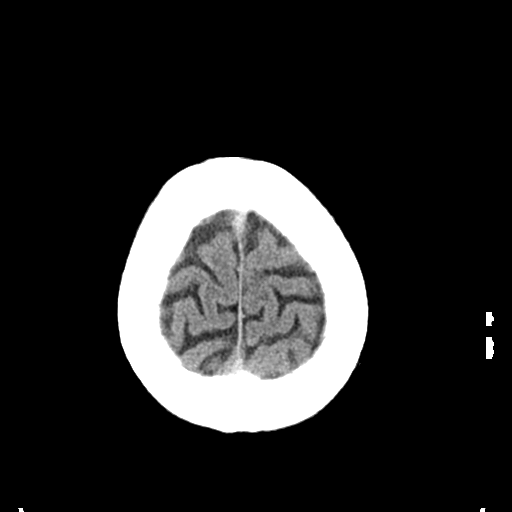
[im 28/34  bone]
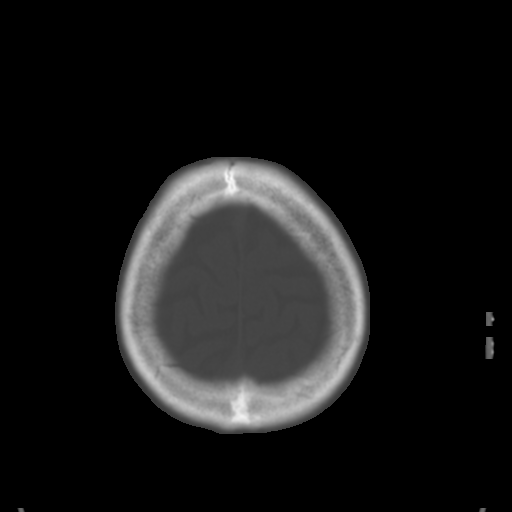
[im 31/34  brain]
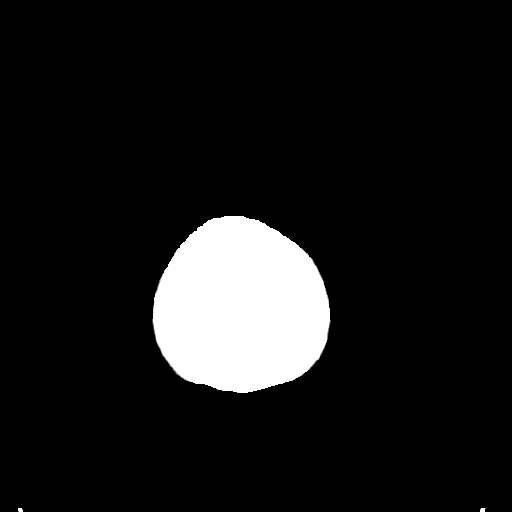

[Series 5: coronal soft tissue · coronal · 0.30mm/px · 3 of 71 slices shown]
[im 24/71  brain]
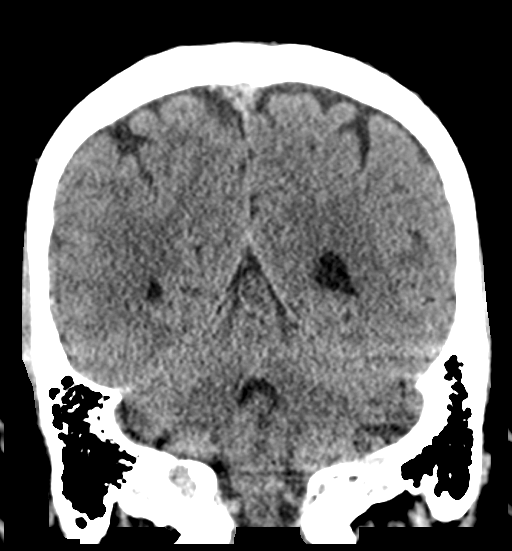
[im 32/71  brain]
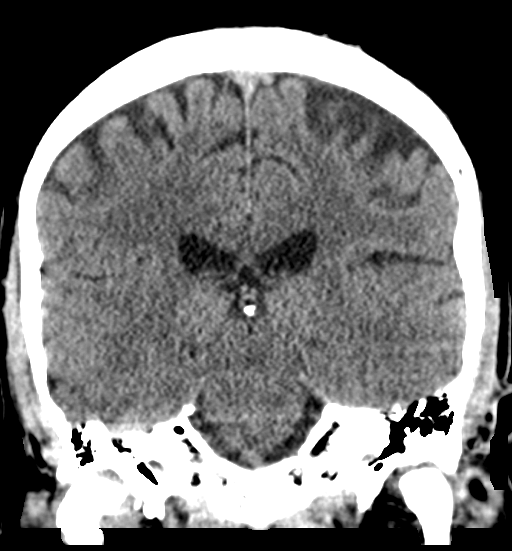
[im 39/71  brain]
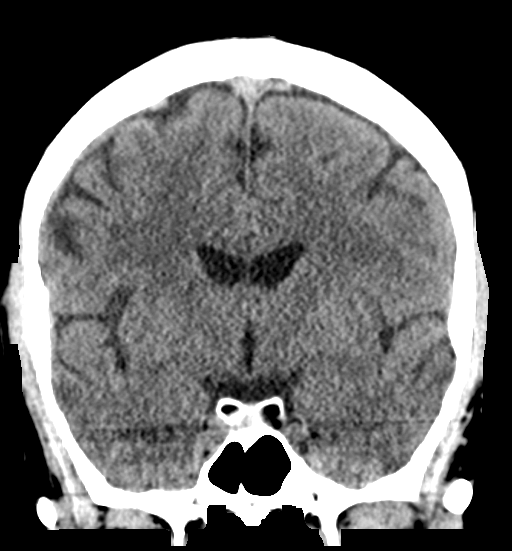

[Series 6: sagittal soft tissue · sagittal · 0.33mm/px · 3 of 53 slices shown]
[im 18/53  brain]
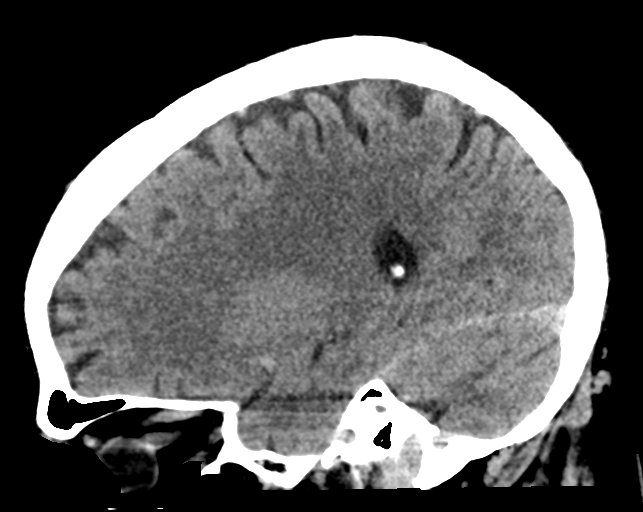
[im 27/53  brain]
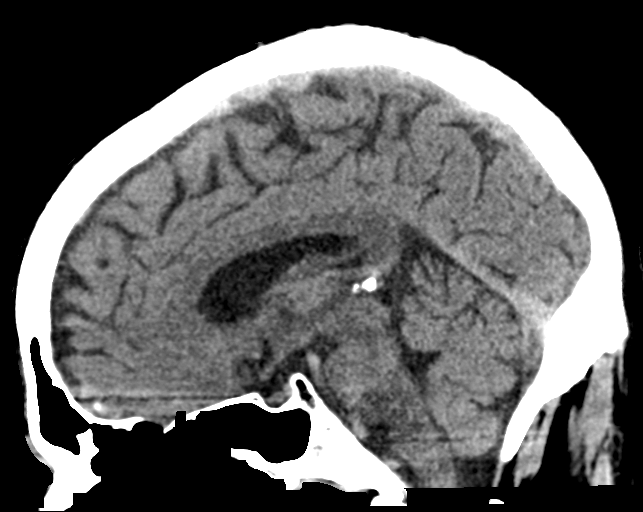
[im 35/53  brain]
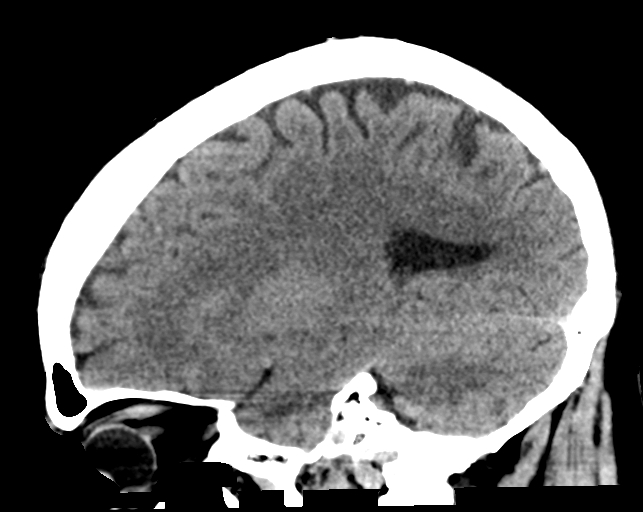

[16 of 47 positions shown; findings below may reference images not displayed]

FINDINGS: Brain: No evidence of acute infarction, hemorrhage, hydrocephalus,
extra-axial collection or mass lesion/mass effect.

Vascular: No hyperdense vessel or unexpected calcification.

Skull: Normal. Negative for fracture or focal lesion.

Sinuses/Orbits: Negative
IMPRESSION: Negative head CT.
